# Patient Record
Sex: Female | Born: 1995 | Race: Black or African American | Hispanic: No | Marital: Single | Smoking: Never smoker
Health system: Southern US, Community
[De-identification: ages and names within clinical notes are randomized; demographics above are authoritative.]

---

## 2017-01-31 ENCOUNTER — Emergency Department (HOSPITAL_COMMUNITY)
Admission: EM | Admit: 2017-01-31 | Discharge: 2017-01-31 | Disposition: A | Discharge status: Home / Self Care | Payer: Self-pay | Attending: Emergency Medicine | Admitting: Emergency Medicine

## 2017-01-31 ENCOUNTER — Other Ambulatory Visit: Payer: Self-pay

## 2017-01-31 ENCOUNTER — Encounter (HOSPITAL_COMMUNITY): Payer: Self-pay | Admitting: Emergency Medicine

## 2017-01-31 DIAGNOSIS — B379 Candidiasis, unspecified: Secondary | ICD-10-CM

## 2017-01-31 DIAGNOSIS — Z202 Contact with and (suspected) exposure to infections with a predominantly sexual mode of transmission: Secondary | ICD-10-CM

## 2017-01-31 DIAGNOSIS — B373 Candidiasis of vulva and vagina: Secondary | ICD-10-CM | POA: Insufficient documentation

## 2017-01-31 DIAGNOSIS — N898 Other specified noninflammatory disorders of vagina: Secondary | ICD-10-CM

## 2017-01-31 LAB — URINALYSIS, ROUTINE W REFLEX MICROSCOPIC
Bilirubin Urine: NEGATIVE
Glucose, UA: NEGATIVE mg/dL
HGB URINE DIPSTICK: NEGATIVE
Ketones, ur: NEGATIVE mg/dL
Leukocytes, UA: NEGATIVE
Nitrite: NEGATIVE
PH: 5 (ref 5.0–8.0)
Protein, ur: NEGATIVE mg/dL
SPECIFIC GRAVITY, URINE: 1.024 (ref 1.005–1.030)

## 2017-01-31 LAB — WET PREP, GENITAL
SPERM: NONE SEEN
Trich, Wet Prep: NONE SEEN

## 2017-01-31 LAB — PREGNANCY, URINE: PREG TEST UR: NEGATIVE

## 2017-01-31 MED ORDER — CEFTRIAXONE SODIUM 250 MG IJ SOLR
250.0000 mg | Freq: Once | INTRAMUSCULAR | Status: AC
  Administered 2017-01-31: 250 mg via INTRAMUSCULAR
  Filled 2017-01-31: qty 250

## 2017-01-31 MED ORDER — AZITHROMYCIN 250 MG PO TABS
1000.0000 mg | ORAL_TABLET | Freq: Once | ORAL | Status: AC
  Administered 2017-01-31: 1000 mg via ORAL
  Filled 2017-01-31: qty 4

## 2017-01-31 MED ORDER — FLUCONAZOLE 150 MG PO TABS
150.0000 mg | ORAL_TABLET | Freq: Once | ORAL | Status: AC
  Administered 2017-01-31: 150 mg via ORAL
  Filled 2017-01-31: qty 1

## 2017-01-31 MED ORDER — LIDOCAINE HCL (PF) 1 % IJ SOLN
INTRAMUSCULAR | Status: AC
  Administered 2017-01-31: 5 mL
  Filled 2017-01-31: qty 5

## 2017-01-31 MED ORDER — FLUCONAZOLE 150 MG PO TABS: 150.0000 mg | ORAL_TABLET | Freq: Once | ORAL | 0 refills | Status: AC

## 2017-01-31 NOTE — ED Provider Notes (Signed)
Emergency Department Provider Note   I have reviewed the triage vital signs and the nursing notes.   HISTORY  Chief Complaint Vaginal Discharge   HPI Holly Ayala is a 21 y.o. female with no significant PMH resents to the emergency department for evaluation of vaginal discharge for the last week.  The patient states that she is concerned that she may have had exposure to an STD.  She is not on birth control.  She denies any lower abdominal pain or back discomfort.  No vaginal bleeding.  Her last menstrual period was in early October.  She states that her menstrual cycle is typically irregular.  She denies any fevers or chills.   History reviewed. No pertinent past medical history.  There are no active problems to display for this patient.   History reviewed. No pertinent surgical history.    Allergies Patient has no known allergies.  History reviewed. No pertinent family history.  Social History Social History   Tobacco Use  . Smoking status: Never Smoker  . Smokeless tobacco: Never Used  Substance Use Topics  . Alcohol use: Yes  . Drug use: No    Review of Systems  Constitutional: No fever/chills Eyes: No visual changes. ENT: No sore throat. Cardiovascular: Denies chest pain. Respiratory: Denies shortness of breath. Gastrointestinal: No abdominal pain.  No nausea, no vomiting.  No diarrhea.  No constipation. Genitourinary: Negative for dysuria. Positive vaginal discharge.  Musculoskeletal: Negative for back pain.  Skin: Negative for rash. Neurological: Negative for headaches, focal weakness or numbness.  10-point ROS otherwise negative.  ____________________________________________   PHYSICAL EXAM:  VITAL SIGNS: ED Triage Vitals [01/31/17 2025]  Enc Vitals Group     BP (!) 143/86     Pulse Rate 83     Resp 16     Temp 97.9 F (36.6 C)     Temp Source Oral     SpO2 100 %     Weight 174 lb (78.9 kg)     Height 5\' 3"  (1.6 m)    Constitutional: Alert and oriented. Well appearing and in no acute distress. Eyes: Conjunctivae are normal. Head: Atraumatic. Nose: No congestion/rhinnorhea. Mouth/Throat: Mucous membranes are moist.  Neck: No stridor.   Cardiovascular: Normal rate, regular rhythm. Good peripheral circulation. Grossly normal heart sounds.   Respiratory: Normal respiratory effort.  No retractions. Lungs CTAB. Gastrointestinal: Soft and nontender. No distention. No CVA tenderness.  Genitourinary: Mild vaginal discharge. No CMT or adnexal tenderness/fullness. Normal external genitalia.  Musculoskeletal: No lower extremity tenderness nor edema. No gross deformities of extremities. Neurologic:  Normal speech and language. No gross focal neurologic deficits are appreciated.  Skin:  Skin is warm, dry and intact. No rash noted.  ____________________________________________   LABS (all labs ordered are listed, but only abnormal results are displayed)  Labs Reviewed  WET PREP, GENITAL - Abnormal; Notable for the following components:      Result Value   Yeast Wet Prep HPF POC PRESENT (*)    Clue Cells Wet Prep HPF POC PRESENT (*)    WBC, Wet Prep HPF POC MANY (*)    All other components within normal limits  URINALYSIS, ROUTINE W REFLEX MICROSCOPIC  PREGNANCY, URINE  HIV ANTIBODY (ROUTINE TESTING)  RPR  GC/CHLAMYDIA PROBE AMP ([place]) NOT AT [facility]   ____________________________________________   PROCEDURES  Procedure(s) performed:   Procedures  None ____________________________________________   INITIAL IMPRESSION / ASSESSMENT AND PLAN / ED COURSE  Pertinent labs & imaging results that  were available during my care of the patient were reviewed by me and considered in my medical decision making (see chart for details).  Patient presents the emergency department for evaluation of discharge with concern for exposure to STD.  No PID symptoms.  Plan for testing including wet prep, HIV, RPR.   Plan to treat empirically for gonorrhea and chlamydia after trichomonas results. Discussed need for partner notification and treatment if positive for STD.   At this time, I do not feel there is any life-threatening condition present. I have reviewed and discussed all results (EKG, imaging, lab, urine as appropriate), exam findings with patient. I have reviewed nursing notes and appropriate previous records.  I feel the patient is safe to be discharged home without further emergent workup. Discussed usual and customary return precautions. Patient and family (if present) verbalize understanding and are comfortable with this plan.  Patient will follow-up with their primary care provider. If they do not have a primary care provider, information for follow-up has been provided to them. All questions have been answered.  ____________________________________________  FINAL CLINICAL IMPRESSION(S) / ED DIAGNOSES  Final diagnoses:  Vaginal discharge  Yeast infection  STD exposure     MEDICATIONS GIVEN DURING THIS VISIT:  Medications  fluconazole (DIFLUCAN) tablet 150 mg (150 mg Oral Given 01/31/17 2249)  cefTRIAXone (ROCEPHIN) injection 250 mg (250 mg Intramuscular Given 01/31/17 2247)  azithromycin (ZITHROMAX) tablet 1,000 mg (1,000 mg Oral Given 01/31/17 2247)  lidocaine (PF) (XYLOCAINE) 1 % injection (5 mLs  Given 01/31/17 2247)     NEW OUTPATIENT MEDICATIONS STARTED DURING THIS VISIT:  Fluconazole   Note:  This document was prepared using Dragon voice recognition software and may include unintentional dictation errors.  Alona BeneJoshua Seher Schlagel, MD Emergency Medicine    Salimatou Simone, Arlyss RepressJoshua G, MD 01/31/17 [phone]

## 2017-01-31 NOTE — ED Notes (Signed)
Pelvic supplies at bedside. 

## 2017-01-31 NOTE — ED Triage Notes (Signed)
Pt states that she would like to be tested for STDs.  Pt states no new partners but is suspicious that her partner may have been with someone new.  Pt states that she has had brownish, watery, foul smelling discharge x 1 week.

## 2017-01-31 NOTE — ED Notes (Signed)
Patient attempting to give urine sample.

## 2017-01-31 NOTE — Discharge Instructions (Signed)

## 2017-02-01 LAB — HIV ANTIBODY (ROUTINE TESTING W REFLEX): HIV Screen 4th Generation wRfx: NONREACTIVE

## 2017-02-01 LAB — GC/CHLAMYDIA PROBE AMP (~~LOC~~) NOT AT ARMC
CHLAMYDIA, DNA PROBE: NEGATIVE
NEISSERIA GONORRHEA: POSITIVE — AB

## 2017-02-01 LAB — RPR: RPR Ser Ql: NONREACTIVE

## 2017-02-08 ENCOUNTER — Emergency Department (HOSPITAL_COMMUNITY)
Admission: EM | Admit: 2017-02-08 | Discharge: 2017-02-08 | Disposition: A | Discharge status: Home / Self Care | Payer: Self-pay | Attending: Emergency Medicine | Admitting: Emergency Medicine

## 2017-02-08 ENCOUNTER — Encounter (HOSPITAL_COMMUNITY): Payer: Self-pay | Admitting: Emergency Medicine

## 2017-02-08 DIAGNOSIS — J039 Acute tonsillitis, unspecified: Secondary | ICD-10-CM | POA: Insufficient documentation

## 2017-02-08 LAB — CBC WITH DIFFERENTIAL/PLATELET
BASOS ABS: 0 10*3/uL (ref 0.0–0.1)
Basophils Relative: 0 %
Eosinophils Absolute: 0.1 10*3/uL (ref 0.0–0.7)
Eosinophils Relative: 1 %
HEMATOCRIT: 40.2 % (ref 36.0–46.0)
Hemoglobin: 13.9 g/dL (ref 12.0–15.0)
LYMPHS PCT: 27 %
Lymphs Abs: 2.3 10*3/uL (ref 0.7–4.0)
MCH: 31.9 pg (ref 26.0–34.0)
MCHC: 34.6 g/dL (ref 30.0–36.0)
MCV: 92.2 fL (ref 78.0–100.0)
MONO ABS: 0.7 10*3/uL (ref 0.1–1.0)
Monocytes Relative: 8 %
NEUTROS ABS: 5.3 10*3/uL (ref 1.7–7.7)
Neutrophils Relative %: 64 %
Platelets: 198 10*3/uL (ref 150–400)
RBC: 4.36 MIL/uL (ref 3.87–5.11)
RDW: 12.9 % (ref 11.5–15.5)
WBC: 8.4 10*3/uL (ref 4.0–10.5)

## 2017-02-08 LAB — RAPID STREP SCREEN (MED CTR MEBANE ONLY): STREPTOCOCCUS, GROUP A SCREEN (DIRECT): NEGATIVE

## 2017-02-08 LAB — MONONUCLEOSIS SCREEN: Mono Screen: NEGATIVE

## 2017-02-08 MED ORDER — DEXAMETHASONE 4 MG PO TABS
10.0000 mg | ORAL_TABLET | Freq: Once | ORAL | Status: AC
  Administered 2017-02-08: 10 mg via ORAL
  Filled 2017-02-08: qty 2

## 2017-02-08 MED ORDER — NAPROXEN 375 MG PO TABS: 375.0000 mg | ORAL_TABLET | Freq: Two times a day (BID) | ORAL | 0 refills | Status: AC

## 2017-02-08 MED ORDER — AMOXICILLIN 500 MG PO CAPS: 500.0000 mg | ORAL_CAPSULE | Freq: Three times a day (TID) | ORAL | 0 refills | Status: AC

## 2017-02-08 NOTE — ED Provider Notes (Signed)
[facility]-EMERGENCY DEPT Provider Note   CSN: [id number] Arrival date & time: 02/08/17  1157     History   Chief Complaint Chief Complaint  Patient presents with  . Generalized Body Aches    HPI Barrington EllisonJa-Niiya Freund is a 21 y.o. female who presents to the ED with generalized body aches, cough and congestion that started yesterday. Patient states that her throat is sore and she has swollen gland on the left side of her neck.  HPI  History reviewed. No pertinent past medical history.  There are no active problems to display for this patient.   History reviewed. No pertinent surgical history.  OB History    No data available       Home Medications    Prior to Admission medications   Medication Sig Start Date End Date Taking? Authorizing Provider  amoxicillin (AMOXIL) 500 MG capsule Take 1 capsule (500 mg total) by mouth 3 (three) times daily. 02/08/17   Janne NapoleonNeese, Hope M, NP  naproxen (NAPROSYN) 375 MG tablet Take 1 tablet (375 mg total) by mouth 2 (two) times daily. 02/08/17   Janne NapoleonNeese, Hope M, NP    Family History No family history on file.  Social History Social History   Tobacco Use  . Smoking status: Never Smoker  . Smokeless tobacco: Never Used  Substance Use Topics  . Alcohol use: Yes  . Drug use: No     Allergies   Patient has no known allergies.   Review of Systems Review of Systems  Constitutional: Negative for chills and fever.  HENT: Positive for congestion, rhinorrhea and sore throat. Negative for trouble swallowing.   Eyes: Negative for visual disturbance.  Respiratory: Negative for shortness of breath. Cough: occasional.   Cardiovascular: Negative for chest pain.  Gastrointestinal: Negative for abdominal pain, diarrhea, nausea and vomiting.  Musculoskeletal: Positive for myalgias. Negative for neck pain and neck stiffness.  Skin: Negative for rash.  Neurological: Positive for headaches. Negative for dizziness and syncope.    Hematological: Positive for adenopathy.  Psychiatric/Behavioral: Negative for confusion. The patient is not nervous/anxious.      Physical Exam Updated Vital Signs BP (!) 142/89   Pulse 79   Temp 98.3 F (36.8 C) (Oral)   Resp 17   SpO2 98%   Physical Exam  Constitutional: She appears well-developed and well-nourished. No distress.  HENT:  Head: Normocephalic.  Right Ear: Tympanic membrane normal.  Left Ear: Tympanic membrane normal.  Nose: Rhinorrhea present.  Mouth/Throat: Uvula is midline. Posterior oropharyngeal erythema present. Tonsils are 2+ on the right. Tonsils are 3+ on the left. Tonsillar exudate.  Eyes: Conjunctivae and EOM are normal. Pupils are equal, round, and reactive to light.  Neck: Normal range of motion. Neck supple.  Cardiovascular: Normal rate and regular rhythm.  Pulmonary/Chest: Effort normal and breath sounds normal.  Abdominal: Soft. There is no tenderness.  Musculoskeletal: Normal range of motion.  Neurological: She is alert.  Skin: Skin is warm and dry.  Psychiatric: She has a normal mood and affect. Her behavior is normal.  Nursing note and vitals reviewed.    ED Treatments / Results  Labs (all labs ordered are listed, but only abnormal results are displayed) Labs Reviewed  RAPID STREP SCREEN (NOT AT [facility])  CULTURE, GROUP A STREP ([facility])  CBC WITH DIFFERENTIAL/PLATELET  MONONUCLEOSIS SCREEN    Radiology No results found.  Procedures Procedures (including critical care time)  Medications Ordered in ED Medications  dexamethasone (DECADRON) tablet 10 mg (not  administered)     Initial Impression / Assessment and Plan / ED Course  I have reviewed the triage vital signs and the nursing notes. Pt afebrile but has tonsillar exudate, negative strep. Presents with mild cervical lymphadenopathy, & dysphagia; diagnosis of tonsillitis.  Discharged with symptomatic tx for pain. Will treat with antibiotics for tonsillitis.  Pt does not appear  dehydrated, but did discuss importance of water rehydration. Presentation non concerning for PTA or RPA. No trismus or uvula deviation. Specific return precautions discussed. Pt able to drink water in ED without difficulty with intact air way. Recommended PCP follow up.  Final Clinical Impressions(s) / ED Diagnoses   Final diagnoses:  Tonsillitis    ED Discharge Orders        Ordered    amoxicillin (AMOXIL) 500 MG capsule  3 times daily     02/08/17 1819    naproxen (NAPROSYN) 375 MG tablet  2 times daily     02/08/17 1819       Kerrie Buffaloeese, Hope EurekaM, NP 02/08/17 [place] [id number]    Donnetta Hutchingook, Brian, MD 02/09/17 1410

## 2017-02-08 NOTE — ED Triage Notes (Signed)
Patient c/o generalized body aches with cough and congestion since yesterday. Denies N/V/D.

## 2017-02-08 NOTE — Discharge Instructions (Addendum)
Take the medication as directed. Follow up with your doctor. Return here if symptoms worsen.

## 2017-02-11 LAB — CULTURE, GROUP A STREP (THRC)

## 2017-02-14 ENCOUNTER — Encounter (HOSPITAL_COMMUNITY): Payer: Self-pay

## 2017-02-14 ENCOUNTER — Other Ambulatory Visit: Payer: Self-pay

## 2017-02-14 ENCOUNTER — Ambulatory Visit (HOSPITAL_COMMUNITY)
Admission: EM | Admit: 2017-02-14 | Discharge: 2017-02-14 | Disposition: A | Discharge status: Other Acute Inpatient Hospital | Payer: Medicaid Other

## 2017-02-14 ENCOUNTER — Emergency Department (HOSPITAL_COMMUNITY)
Admission: EM | Admit: 2017-02-14 | Discharge: 2017-02-14 | Disposition: A | Discharge status: Home / Self Care | Payer: Medicaid Other | Attending: Emergency Medicine | Admitting: Emergency Medicine

## 2017-02-14 DIAGNOSIS — J029 Acute pharyngitis, unspecified: Secondary | ICD-10-CM | POA: Insufficient documentation

## 2017-02-14 LAB — RAPID STREP SCREEN (MED CTR MEBANE ONLY): Streptococcus, Group A Screen (Direct): NEGATIVE

## 2017-02-14 MED ORDER — AMOXICILLIN-POT CLAVULANATE 875-125 MG PO TABS: 1.0000 | ORAL_TABLET | Freq: Two times a day (BID) | ORAL | 0 refills | Status: AC

## 2017-02-14 MED ORDER — DEXAMETHASONE SODIUM PHOSPHATE 10 MG/ML IJ SOLN
10.0000 mg | Freq: Once | INTRAMUSCULAR | Status: AC
  Administered 2017-02-14: 10 mg via INTRAMUSCULAR
  Filled 2017-02-14: qty 1

## 2017-02-14 MED ORDER — LIDOCAINE VISCOUS 2 % MT SOLN: 15.0000 mL | OROMUCOSAL | 0 refills | Status: AC | PRN

## 2017-02-14 MED ORDER — ACETAMINOPHEN 325 MG PO TABS
650.0000 mg | ORAL_TABLET | Freq: Once | ORAL | Status: AC | PRN
  Administered 2017-02-14: 650 mg via ORAL
  Filled 2017-02-14: qty 2

## 2017-02-14 NOTE — ED Triage Notes (Signed)
Pt presents for evaluation of sore throat, fever 102 at home. Pt febrile 103.9 in triage, reports fatigue and headache. Denies cough or other URI symptoms. [facility]

## 2017-02-14 NOTE — ED Notes (Signed)
See provider assessment 

## 2017-02-14 NOTE — ED Provider Notes (Signed)
[facility]ERGENC[facility] Provider Note   CSN: [id number] Arrival date & time: 02/14/17  1357     History   Chief Complaint Chief Complaint  Patient presents with  . Sore Throat    HPI Holly Ayala is a 21 y.o. female with no significant past medical history, who presents to ED for evaluation of one-week history of sore throat.  She noticed fevers, body aches, headaches since yesterday.  She has not tried any medications to help with symptoms.  She was seen initially 1 week ago with a negative strep test and negative mono screen. She was given amoxicillin but did not complete the course.  She denies any sick contacts, nausea, vomiting, other URI symptoms, cough, abdominal pain, vision changes, injuries or falls.  HPI  History reviewed. No pertinent past medical history.  There are no active problems to display for this patient.   History reviewed. No pertinent surgical history.  OB History    No data available       Home Medications    Prior to Admission medications   Medication Sig Start Date End Date Taking? Authorizing Provider  amoxicillin (AMOXIL) 500 MG capsule Take 1 capsule (500 mg total) by mouth 3 (three) times daily. 02/08/17   Janne NapoleonNeese, Hope M, NP  amoxicillin-clavulanate (AUGMENTIN) 875-125 MG tablet Take 1 tablet by mouth every 12 (twelve) hours. 02/14/17   Milka Windholz, PA-C  lidocaine (XYLOCAINE) 2 % solution Use as directed 15 mLs in the mouth or throat as needed for mouth pain. 02/14/17   Aleatha Taite, PA-C  naproxen (NAPROSYN) 375 MG tablet Take 1 tablet (375 mg total) by mouth 2 (two) times daily. 02/08/17   Janne NapoleonNeese, Hope M, NP    Family History No family history on file.  Social History Social History   Tobacco Use  . Smoking status: Never Smoker  . Smokeless tobacco: Never Used  Substance Use Topics  . Alcohol use: Yes  . Drug use: No     Allergies   Patient has no known allergies.   Review of Systems Review  of Systems  Constitutional: Positive for chills and fever. Negative for appetite change.  HENT: Positive for sore throat and trouble swallowing. Negative for congestion, drooling, ear discharge, ear pain, facial swelling, nosebleeds, sinus pressure, sinus pain, sneezing and voice change.   Gastrointestinal: Negative for abdominal pain, diarrhea and vomiting.  Musculoskeletal: Negative for neck pain and neck stiffness.  Neurological: Positive for headaches. Negative for dizziness, weakness and light-headedness.     Physical Exam Updated Vital Signs BP 132/75 (BP Location: Right Arm)   Pulse 100   Temp (!) 100.8 F (38.2 C) (Oral)   Resp 18   SpO2 100%   Physical Exam  Constitutional: She is oriented to person, place, and time. She appears well-developed and well-nourished. No distress.  HENT:  Head: Normocephalic and atraumatic.  Right Ear: Tympanic membrane normal.  Left Ear: Tympanic membrane normal.  Nose: Nose normal.  Mouth/Throat: Uvula is midline. Posterior oropharyngeal erythema present. Tonsils are 2+ on the right. Tonsils are 2+ on the left. Tonsillar exudate.  Patient does not appear to be in acute distress. No trismus or drooling present. No pooling of secretions. Patient is tolerating secretions and is not in respiratory distress. No neck pain or tenderness to palpation of the neck. Full active and passive range of motion of the neck. No evidence of RPA or PTA.  Eyes: Conjunctivae and EOM are normal. Pupils are equal, round, and  reactive to light. Right eye exhibits no discharge. Left eye exhibits no discharge. No scleral icterus.  Neck: Normal range of motion.  Pulmonary/Chest: Effort normal. No respiratory distress.  Neurological: She is alert and oriented to person, place, and time. No cranial nerve deficit or sensory deficit. She exhibits normal muscle tone. Coordination normal.  Pupils reactive. No facial asymmetry noted. Cranial nerves appear grossly intact. Sensation  intact to light touch on face.  Skin: No rash noted. She is not diaphoretic.  Psychiatric: She has a normal mood and affect.  Nursing note and vitals reviewed.    ED Treatments / Results  Labs (all labs ordered are listed, but only abnormal results are displayed) Labs Reviewed  RAPID STREP SCREEN (NOT AT [facility])  CULTURE, GROUP A STREP [facility])    EKG  EKG Interpretation None       Radiology No results found.  Procedures Procedures (including critical care time)  Medications Ordered in ED Medications  acetaminophen (TYLENOL) tablet 650 mg (650 mg Oral Given 02/14/17 1420)  dexamethasone (DECADRON) injection 10 mg (10 mg Intramuscular Given 02/14/17 1855)     Initial Impression / Assessment and Plan / ED Course  I have reviewed the triage vital signs and the nursing notes.  Pertinent labs & imaging results that were available during my care of the patient were reviewed by me and considered in my medical decision making (see chart for details).     Patient presents to ED for evaluation of sore throat for the past week. She also reports fever, generalized bodyaches and headache since last night. No sick contacts with similar symptoms. She was seen and evaluated in ED last week with negative strep and mono test and was placed on amoxicillin.  She returns today with no improvement in symptoms.  She is febrile here in the ED to 103.9.  She is nontoxic-appearing and in no acute distress.  She does have tonsillar exudates and tonsillar edema noted.  Uvula is midline.  Low suspicion for RPA or PTA being the cause of her sore throat.  Strep test returned as negative.  Lab work last week was reassuring.  I do believe she is suffering from a viral illness and possibly bacterial pharyngitis.  We will give her Decadron here in the ED to help with inflammation and swelling and will place on Augmentin.  Also give lidocaine to swish and spit to help with discomfort.  Advised to [facility] and to take Tylenol or ibuprofen as needed for fevers and pain.  Discussed the benefits of flu testing and treatment and patient declines at this time.  Patient appears stable for discharge at this time.  Strict return precautions given.  Final Clinical Impressions(s) / ED Diagnoses   Final diagnoses:  Pharyngitis, unspecified etiology    ED Discharge Orders        Ordered    amoxicillin-clavulanate (AUGMENTIN) 875-125 MG tablet  Every 12 hours     02/14/17 1853    lidocaine (XYLOCAINE) 2 % solution  As needed     02/14/17 1853       Dietrich PatesKhatri, Amerigo Mcglory, PA-C 02/14/17 1904    Loren RacerYelverton, David, MD 02/14/17 1910

## 2017-02-14 NOTE — Discharge Instructions (Signed)
Please read the attached information regarding your condition. Take Augmentin twice daily for 1 week. Swish and spit lidocaine solution as needed for discomfort. [facility]. Take Tylenol or ibuprofen as needed for pain and fevers. Return to ED for worsening sore throat, trouble breathing, trouble swallowing, chest pain.

## 2017-02-16 LAB — CULTURE, GROUP A STREP (THRC)

## 2017-02-20 ENCOUNTER — Emergency Department (HOSPITAL_COMMUNITY): Payer: Self-pay

## 2017-02-20 ENCOUNTER — Encounter (HOSPITAL_COMMUNITY): Payer: Self-pay | Admitting: *Deleted

## 2017-02-20 ENCOUNTER — Emergency Department (HOSPITAL_COMMUNITY)
Admission: EM | Admit: 2017-02-20 | Discharge: 2017-02-20 | Disposition: A | Discharge status: Home / Self Care | Payer: Self-pay | Attending: Emergency Medicine | Admitting: Emergency Medicine

## 2017-02-20 DIAGNOSIS — R1310 Dysphagia, unspecified: Secondary | ICD-10-CM | POA: Insufficient documentation

## 2017-02-20 DIAGNOSIS — J029 Acute pharyngitis, unspecified: Secondary | ICD-10-CM | POA: Insufficient documentation

## 2017-02-20 LAB — COMPREHENSIVE METABOLIC PANEL
ALBUMIN: 3.1 g/dL — AB (ref 3.5–5.0)
ALT: 31 U/L (ref 14–54)
AST: 21 U/L (ref 15–41)
Alkaline Phosphatase: 65 U/L (ref 38–126)
Anion gap: 8 (ref 5–15)
BILIRUBIN TOTAL: 0.6 mg/dL (ref 0.3–1.2)
BUN: 8 mg/dL (ref 6–20)
CHLORIDE: 103 mmol/L (ref 101–111)
CO2: 23 mmol/L (ref 22–32)
Calcium: 8.8 mg/dL — ABNORMAL LOW (ref 8.9–10.3)
Creatinine, Ser: 1.13 mg/dL — ABNORMAL HIGH (ref 0.44–1.00)
GFR calc Af Amer: >60 mL/min (ref >60)
GFR calc non Af Amer: >60 mL/min (ref >60)
GLUCOSE: 102 mg/dL — AB (ref 65–99)
POTASSIUM: 4.2 mmol/L (ref 3.5–5.1)
Sodium: 134 mmol/L — ABNORMAL LOW (ref 135–145)
Total Protein: 7.5 g/dL (ref 6.5–8.1)

## 2017-02-20 LAB — CBC WITH DIFFERENTIAL/PLATELET
Basophils Absolute: 0 10*3/uL (ref 0.0–0.1)
Basophils Relative: 0 %
EOS PCT: 0 %
Eosinophils Absolute: 0 10*3/uL (ref 0.0–0.7)
HCT: 37.3 % (ref 36.0–46.0)
Hemoglobin: 13.2 g/dL (ref 12.0–15.0)
LYMPHS ABS: 2.7 10*3/uL (ref 0.7–4.0)
LYMPHS PCT: 23 %
MCH: 32.8 pg (ref 26.0–34.0)
MCHC: 35.4 g/dL (ref 30.0–36.0)
MCV: 92.6 fL (ref 78.0–100.0)
MONO ABS: 1 10*3/uL (ref 0.1–1.0)
Monocytes Relative: 9 %
Neutro Abs: 7.8 10*3/uL — ABNORMAL HIGH (ref 1.7–7.7)
Neutrophils Relative %: 68 %
PLATELETS: 251 10*3/uL (ref 150–400)
RBC: 4.03 MIL/uL (ref 3.87–5.11)
RDW: 13.2 % (ref 11.5–15.5)
WBC: 11.6 10*3/uL — ABNORMAL HIGH (ref 4.0–10.5)

## 2017-02-20 LAB — I-STAT CG4 LACTIC ACID, ED
LACTIC ACID, VENOUS: 0.78 mmol/L (ref 0.5–1.9)
Lactic Acid, Venous: 0.71 mmol/L (ref 0.5–1.9)

## 2017-02-20 LAB — I-STAT BETA HCG BLOOD, ED (MC, WL, AP ONLY): I-stat hCG, quantitative: <5 m[IU]/mL (ref <5)

## 2017-02-20 MED ORDER — ACETAMINOPHEN 500 MG PO TABS
1000.0000 mg | ORAL_TABLET | Freq: Once | ORAL | Status: AC
  Administered 2017-02-20: 1000 mg via ORAL
  Filled 2017-02-20: qty 2

## 2017-02-20 MED ORDER — IBUPROFEN 800 MG PO TABS: 800.0000 mg | ORAL_TABLET | Freq: Three times a day (TID) | ORAL | 0 refills | Status: AC | PRN

## 2017-02-20 MED ORDER — IBUPROFEN 100 MG/5ML PO SUSP
600.0000 mg | Freq: Once | ORAL | Status: AC
Start: 2017-02-20 — End: 2017-02-20
  Administered 2017-02-20: 600 mg via ORAL
  Filled 2017-02-20: qty 30

## 2017-02-20 MED ORDER — PREDNISONE 20 MG PO TABS: 20.0000 mg | ORAL_TABLET | Freq: Every day | ORAL | 0 refills | Status: AC

## 2017-02-20 NOTE — ED Triage Notes (Addendum)
Pt reports ongoing sore throat. Has been seen multiple times over past few two weeks for sore throat. Had negative strep but reports increase in fever and throat swelling. Airway intact at triage. Unable to swallow her meds this am.

## 2017-02-20 NOTE — Discharge Instructions (Addendum)
You were seen in the ED today with sore throat. The x-ray was negative and you are likely suffering from a viral illness. Take Tylenol and Motrin as needed for pain. I am putting you on steroid pills for the next 5 days to help decrease swelling and pain.   Return to the ED immediately with any worsening pain, inability to swallow your saliva, difficulty breathing, or other concerning symptoms.

## 2017-02-20 NOTE — ED Notes (Signed)
ED Provider at bedside. 

## 2017-02-20 NOTE — ED Provider Notes (Signed)
Emergency Department Provider Note   I have reviewed the triage vital signs and the nursing notes.   HISTORY  Chief Complaint Sore Throat and Fever   HPI Holly Ayala is a 21 y.o. female presents to the emergency department for evaluation of continued sore throat, fevers, and pain with swallowing.  She was seen in the emergency department and treated for strep pharyngitis with amoxicillin.  She completed this course along with IM Decadron. Patient tested negative for Mono on 11/20 and rapid strep and culture negative on 11/26.  Patient states she has had worsening pain with swallowing, especially when laying flat.  She continues to have fevers.  She is swallowing her secretions and speaking in a normal tone of voice.   History reviewed. No pertinent past medical history.  There are no active problems to display for this patient.   History reviewed. No pertinent surgical history.  Current Outpatient Rx  . Order #: [id number] Class: Print  . Order #: [id number] Class: Print  . Order #: [id number] Class: Print  . Order #: [id number] Class: Print  . Order #: [id number] Class: Print  . Order #: [id number] Class: Print    Allergies Patient has no known allergies.  History reviewed. No pertinent family history.  Social History Social History   Tobacco Use  . Smoking status: Never Smoker  . Smokeless tobacco: Never Used  Substance Use Topics  . Alcohol use: Yes  . Drug use: No    Review of Systems  Constitutional: Positive fever/chills Eyes: No visual changes. ENT: Positive sore throat. Cardiovascular: Denies chest pain. Respiratory: Denies shortness of breath. Gastrointestinal: No abdominal pain.  No nausea, no vomiting.  No diarrhea.  No constipation. Genitourinary: Negative for dysuria. Musculoskeletal: Negative for back pain. Skin: Negative for rash. Neurological: Negative for headaches, focal weakness or numbness.  10-point ROS otherwise  negative.  ____________________________________________   PHYSICAL EXAM:  VITAL SIGNS: ED Triage Vitals  Enc Vitals Group     BP 02/20/17 1323 132/63     Pulse Rate 02/20/17 1323 (!) 115     Resp 02/20/17 1323 14     Temp 02/20/17 1323 (!) 103.1 F (39.5 C)     Temp Source 02/20/17 1323 Oral     SpO2 02/20/17 1323 100 %     Weight 02/20/17 1323 174 lb (78.9 kg)     Height 02/20/17 1323 5\' 3"  (1.6 m)     Pain Score 02/20/17 1329 10   Vitals:   02/20/17 1645 02/20/17 1737  BP:  107/73  Pulse:  99  Resp:  16  Temp: (!) 103.2 F (39.6 C) 98.3 F (36.8 C)  SpO2:  97%     Constitutional: Alert and oriented. Well appearing and in no acute distress. Eyes: Conjunctivae are normal.  Head: Atraumatic. Nose: No congestion/rhinnorhea. Mouth/Throat: Mucous membranes are moist.  Oropharynx with tonsillar hypertrophy and exudate. No PTA. Speaking in a clear voice and managing oral secretions. Soft submanibular compartment.  Neck: No stridor.  No meningeal signs.  Cardiovascular: Normal rate, regular rhythm. Good peripheral circulation. Grossly normal heart sounds.   Respiratory: Normal respiratory effort.  No retractions. Lungs CTAB. Gastrointestinal: Soft and nontender. No distention.  Musculoskeletal: No lower extremity tenderness nor edema. No gross deformities of extremities. Neurologic:  Normal speech and language. No gross focal neurologic deficits are appreciated.  Skin:  Skin is warm, dry and intact. No rash noted.  ____________________________________________   LABS (all labs ordered are listed, but only abnormal results are displayed)  Labs Reviewed  COMPREHENSIVE METABOLIC PANEL - Abnormal; Notable for the following components:      Result Value   Sodium 134 (*)    Glucose, Bld 102 (*)    Creatinine, Ser 1.13 (*)    Calcium 8.8 (*)    Albumin 3.1 (*)    All other components within normal limits  CBC WITH DIFFERENTIAL/PLATELET - Abnormal; Notable for the  following components:   WBC 11.6 (*)    Neutro Abs 7.8 (*)    All other components within normal limits  I-STAT CG4 LACTIC ACID, ED  I-STAT BETA HCG BLOOD, ED ([facility], [facility], [facility] ONLY)  I-STAT CG4 LACTIC ACID, ED   ____________________________________________  RADIOLOGY  Dg Neck Soft Tissue  Result Date: 02/20/2017 CLINICAL DATA:  Pt reports ongoing sore throat. Has been seen multiple times over past few two weeks for sore throat. Had negative strep but reports increase in fever and throat swelling. Airway intact at triage. Unable to swallow her meds this am. EXAM: NECK SOFT TISSUES - 1+ VIEW COMPARISON:  None. FINDINGS: There is no evidence of retropharyngeal soft tissue swelling or epiglottic enlargement. The cervical airway is unremarkable and no radio-opaque foreign body identified. IMPRESSION: Negative. Electronically Signed   By: Amie Portlandavid  Ormond M.D.   On: 02/20/2017 16:36    ____________________________________________   PROCEDURES  Procedure(s) performed:   Procedures  None ____________________________________________   INITIAL IMPRESSION / ASSESSMENT AND PLAN / ED COURSE  Pertinent labs & imaging results that were available during my care of the patient were reviewed by me and considered in my medical decision making (see chart for details).  Patient presents to the emergency department for evaluation of continued sore throat with painful swallowing and fever.  Rapid strep and strep culture negative.  Tested for mono on 11/20 which was also negative.  Patient in no acute distress.  She does have bilateral tonsillar hypertrophy with exudate.  No peritonsillar abscess.  No voice changes.  She has a soft submandibular compartment. No drooling. Patient tolerating PO at home but having pain. Plain film of the neck is normal. Patient temp decreased with Motrin and Tylenol. Advised cool fluids at home with Tylenol/Motrin for pain. Plan to extend steroid coverage. No indication at this time  for CT of the neck given my exam. Drinking fluids and taking pills in the ED without difficulty. Well-appearing at discharge.   At this time, I do not feel there is any life-threatening condition present. I have reviewed and discussed all results (EKG, imaging, lab, urine as appropriate), exam findings with patient. I have reviewed nursing notes and appropriate previous records.  I feel the patient is safe to be discharged home without further emergent workup. Discussed usual and customary return precautions. Patient and family (if present) verbalize understanding and are comfortable with this plan.  Patient will follow-up with their primary care provider. If they do not have a primary care provider, information for follow-up has been provided to them. All questions have been answered.  ____________________________________________  FINAL CLINICAL IMPRESSION(S) / ED DIAGNOSES  Final diagnoses:  Pharyngitis, unspecified etiology  Odynophagia     MEDICATIONS GIVEN DURING THIS VISIT:  Medications  acetaminophen (TYLENOL) tablet 1,000 mg (1,000 mg Oral Given 02/20/17 1646)  ibuprofen (ADVIL,MOTRIN) 100 MG/5ML suspension 600 mg (600 mg Oral Given 02/20/17 1713)     NEW OUTPATIENT MEDICATIONS STARTED DURING THIS VISIT:  Motrin and Prednisone x 5 days.   Note:  This document was prepared using Conservation officer, historic buildingsDragon voice recognition software and may  include unintentional dictation errors.  Alona BeneJoshua Long, MD Emergency Medicine    Long, Arlyss RepressJoshua G, MD 02/20/17 Izell [id number]

## 2018-03-21 IMAGING — DX DG NECK SOFT TISSUE
3 series · 3 of 3 positions shown · non-contrast
Comparison: None.

CLINICAL DATA: Pt reports ongoing sore throat. Has been seen
multiple times over past few two weeks for sore throat. Had negative
strep but reports increase in fever and throat swelling. Airway
intact at triage. Unable to swallow her meds this am.

EXAM:
NECK SOFT TISSUES - 1+ VIEW

[w soft tissue neck lat (1 of 2)]
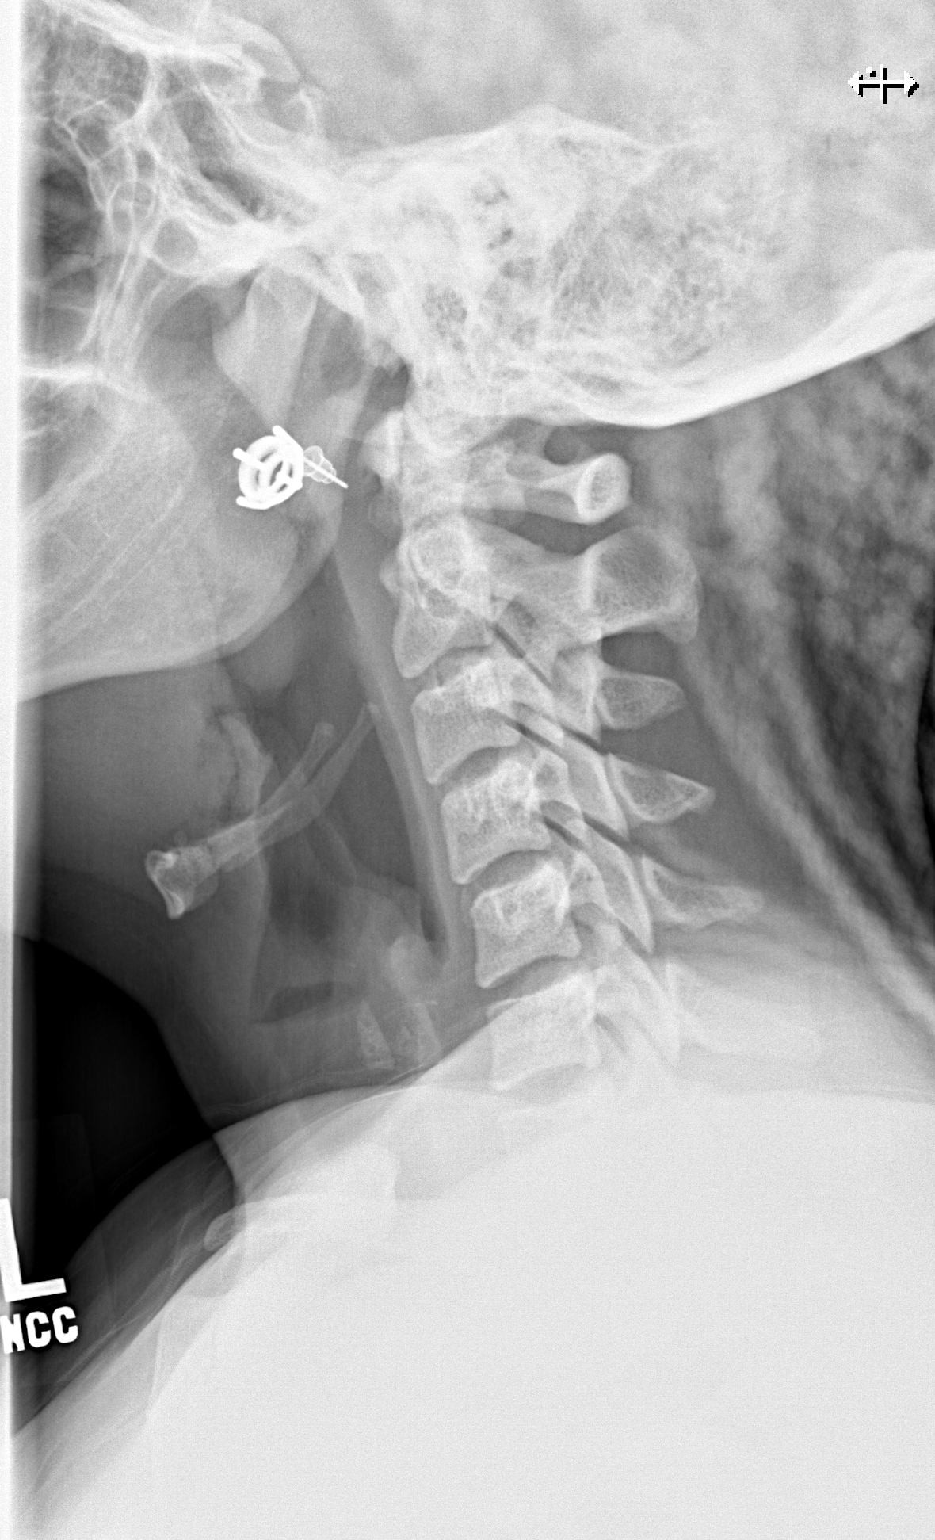

[w soft tissue neck lat (2 of 2)]
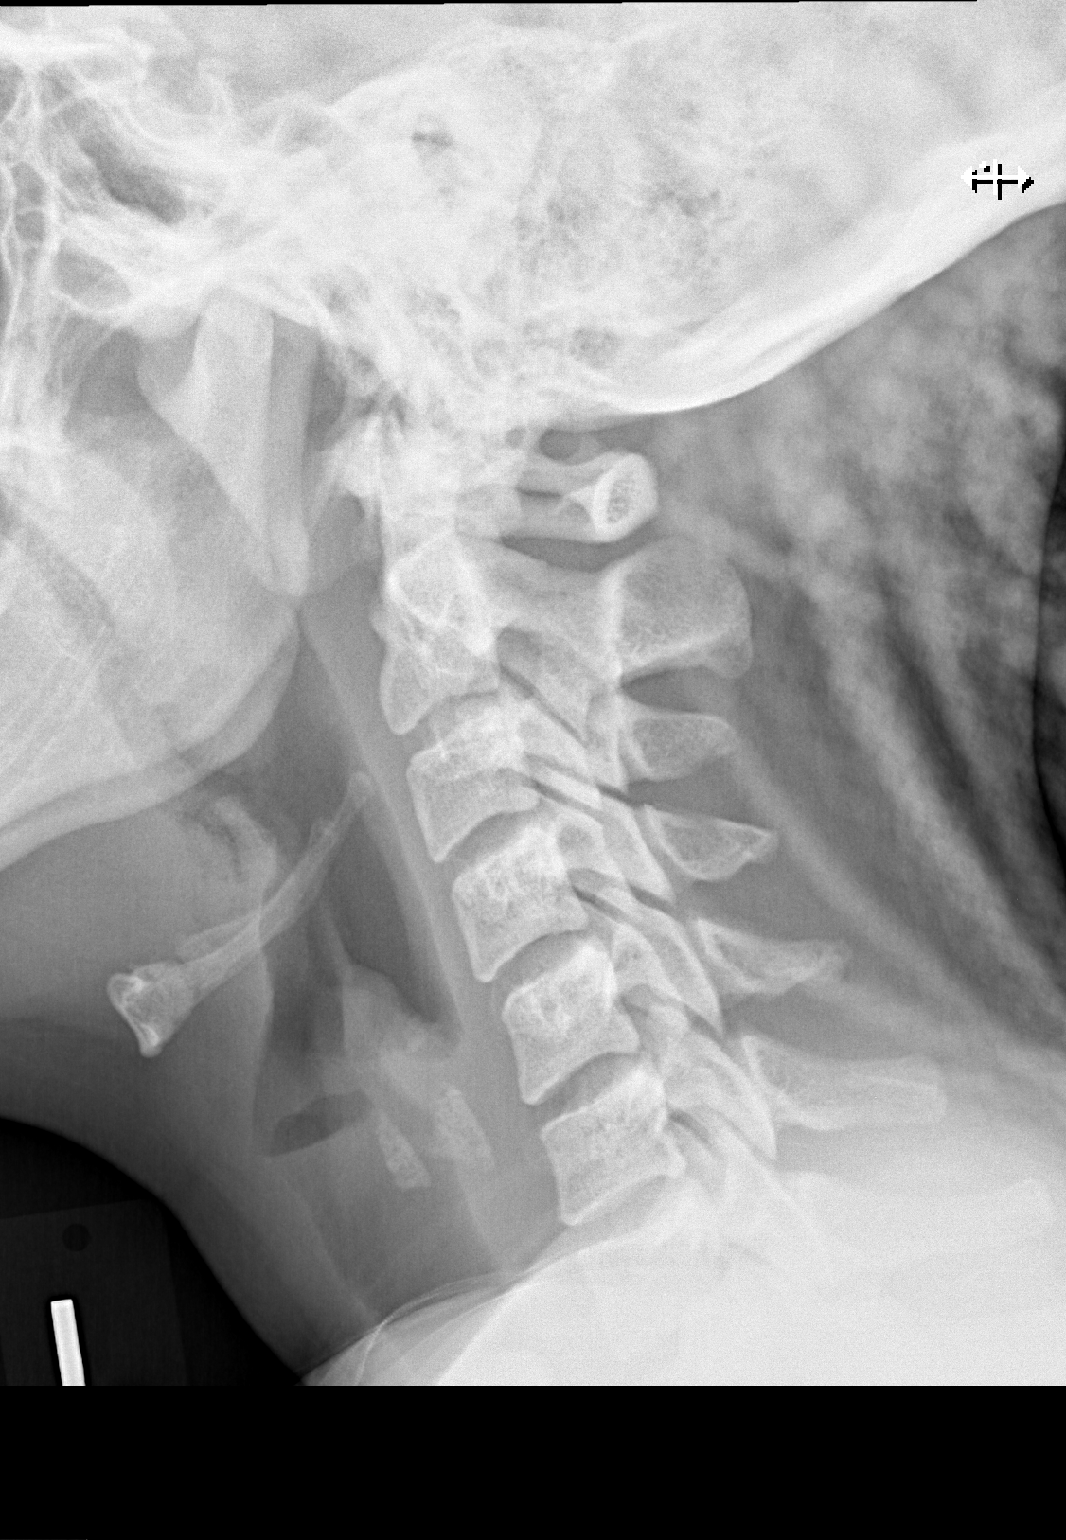

[w soft tissue neck ap]
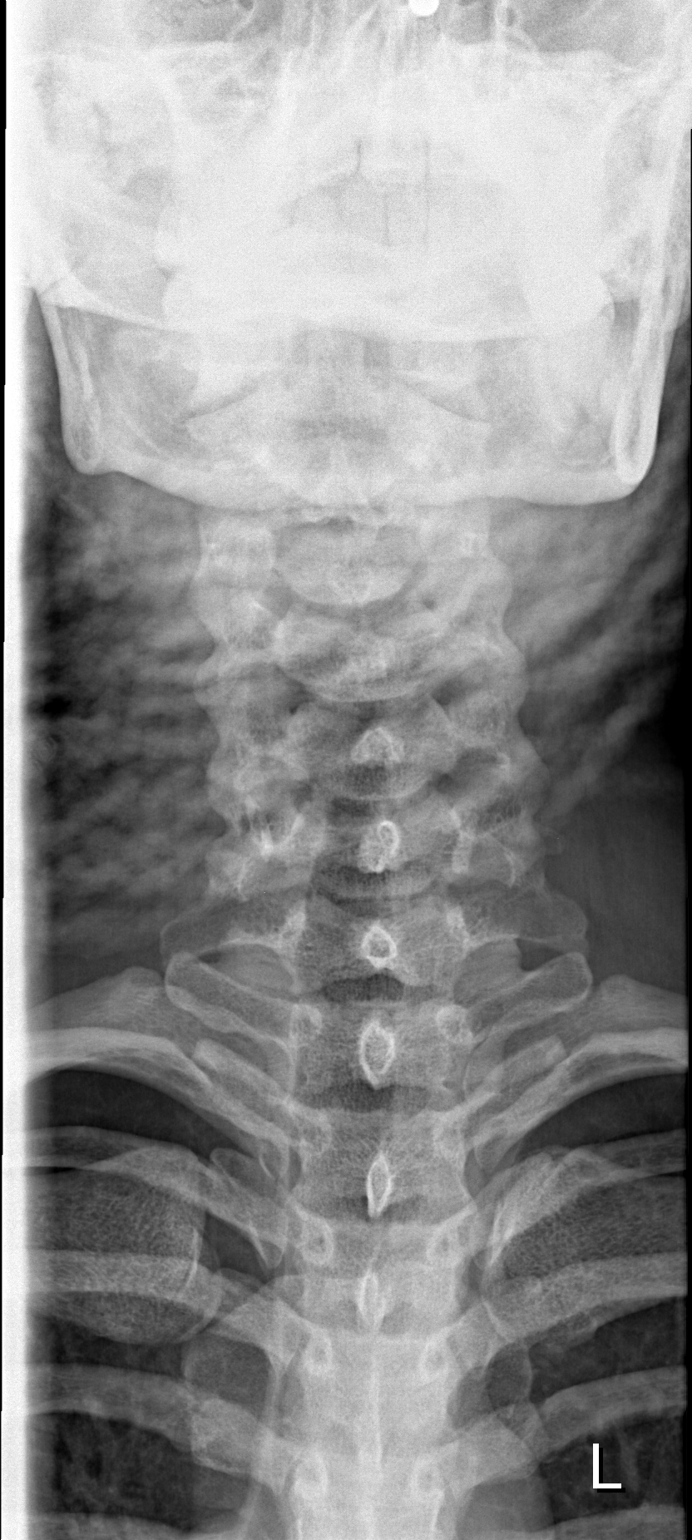

[3 of 3 positions shown; findings below may reference images not displayed]

FINDINGS: There is no evidence of retropharyngeal soft tissue swelling or
epiglottic enlargement. The cervical airway is unremarkable and no
radio-opaque foreign body identified.
IMPRESSION: Negative.
